# Patient Record
Sex: Female | Born: 1935 | Race: White | Hispanic: No | Smoking: Never smoker
Health system: Southern US, Community
[De-identification: ages and names within clinical notes are randomized; demographics above are authoritative.]

---

## 1998-02-16 ENCOUNTER — Ambulatory Visit (HOSPITAL_COMMUNITY): Admission: RE | Admit: 1998-02-16 | Discharge: 1998-02-16 | Payer: Self-pay

## 1998-02-20 ENCOUNTER — Ambulatory Visit (HOSPITAL_COMMUNITY): Admission: RE | Admit: 1998-02-20 | Discharge: 1998-02-20 | Payer: Self-pay | Admitting: Internal Medicine

## 1998-09-01 ENCOUNTER — Encounter: Payer: Self-pay | Admitting: Internal Medicine

## 1998-09-01 ENCOUNTER — Ambulatory Visit (HOSPITAL_COMMUNITY): Admission: RE | Admit: 1998-09-01 | Discharge: 1998-09-01 | Payer: Self-pay | Admitting: Internal Medicine

## 1999-03-15 ENCOUNTER — Ambulatory Visit (HOSPITAL_COMMUNITY): Admission: RE | Admit: 1999-03-15 | Discharge: 1999-03-15 | Payer: Self-pay | Admitting: Internal Medicine

## 1999-03-15 ENCOUNTER — Encounter: Payer: Self-pay | Admitting: Internal Medicine

## 2000-03-21 ENCOUNTER — Ambulatory Visit (HOSPITAL_COMMUNITY): Admission: RE | Admit: 2000-03-21 | Discharge: 2000-03-21 | Payer: Self-pay | Admitting: Internal Medicine

## 2000-03-21 ENCOUNTER — Encounter: Payer: Self-pay | Admitting: Internal Medicine

## 2001-04-26 ENCOUNTER — Ambulatory Visit (HOSPITAL_COMMUNITY): Admission: RE | Admit: 2001-04-26 | Discharge: 2001-04-26 | Payer: Self-pay | Admitting: Internal Medicine

## 2001-04-26 ENCOUNTER — Encounter: Payer: Self-pay | Admitting: Internal Medicine

## 2002-05-02 ENCOUNTER — Ambulatory Visit (HOSPITAL_COMMUNITY): Admission: RE | Admit: 2002-05-02 | Discharge: 2002-05-02 | Payer: Self-pay | Admitting: Internal Medicine

## 2002-05-02 ENCOUNTER — Encounter: Payer: Self-pay | Admitting: Internal Medicine

## 2003-05-09 ENCOUNTER — Ambulatory Visit (HOSPITAL_COMMUNITY): Admission: RE | Admit: 2003-05-09 | Discharge: 2003-05-09 | Payer: Self-pay | Admitting: Internal Medicine

## 2004-05-13 ENCOUNTER — Ambulatory Visit (HOSPITAL_COMMUNITY): Admission: RE | Admit: 2004-05-13 | Discharge: 2004-05-13 | Payer: Self-pay | Admitting: Internal Medicine

## 2004-12-06 ENCOUNTER — Encounter: Admission: RE | Admit: 2004-12-06 | Discharge: 2004-12-06 | Payer: Self-pay | Admitting: Internal Medicine

## 2005-05-18 ENCOUNTER — Ambulatory Visit (HOSPITAL_COMMUNITY): Admission: RE | Admit: 2005-05-18 | Discharge: 2005-05-18 | Payer: Self-pay | Admitting: Internal Medicine

## 2006-05-19 ENCOUNTER — Ambulatory Visit (HOSPITAL_COMMUNITY): Admission: RE | Admit: 2006-05-19 | Discharge: 2006-05-19 | Payer: Self-pay | Admitting: Internal Medicine

## 2007-05-28 ENCOUNTER — Ambulatory Visit (HOSPITAL_COMMUNITY): Admission: RE | Admit: 2007-05-28 | Discharge: 2007-05-28 | Payer: Self-pay | Admitting: Internal Medicine

## 2008-06-02 ENCOUNTER — Ambulatory Visit (HOSPITAL_COMMUNITY): Admission: RE | Admit: 2008-06-02 | Discharge: 2008-06-02 | Payer: Self-pay | Admitting: Internal Medicine

## 2009-06-03 ENCOUNTER — Ambulatory Visit (HOSPITAL_COMMUNITY): Admission: RE | Admit: 2009-06-03 | Discharge: 2009-06-03 | Payer: Self-pay | Admitting: Internal Medicine

## 2010-05-06 ENCOUNTER — Other Ambulatory Visit (HOSPITAL_COMMUNITY): Payer: Self-pay | Admitting: *Deleted

## 2010-05-06 DIAGNOSIS — Z1231 Encounter for screening mammogram for malignant neoplasm of breast: Secondary | ICD-10-CM

## 2010-06-04 ENCOUNTER — Ambulatory Visit (HOSPITAL_COMMUNITY): Payer: MEDICARE | Attending: *Deleted

## 2010-06-08 ENCOUNTER — Ambulatory Visit (HOSPITAL_BASED_OUTPATIENT_CLINIC_OR_DEPARTMENT_OTHER)
Admission: RE | Admit: 2010-06-08 | Discharge: 2010-06-08 | Disposition: A | Discharge status: Home / Self Care | Payer: MEDICARE | Source: Ambulatory Visit | Attending: Diagnostic Radiology | Admitting: Diagnostic Radiology

## 2010-06-08 ENCOUNTER — Other Ambulatory Visit: Payer: Self-pay

## 2010-06-08 DIAGNOSIS — Z1231 Encounter for screening mammogram for malignant neoplasm of breast: Secondary | ICD-10-CM

## 2011-05-19 ENCOUNTER — Other Ambulatory Visit (HOSPITAL_BASED_OUTPATIENT_CLINIC_OR_DEPARTMENT_OTHER): Payer: Self-pay | Admitting: Internal Medicine

## 2011-05-19 DIAGNOSIS — Z1231 Encounter for screening mammogram for malignant neoplasm of breast: Secondary | ICD-10-CM

## 2011-06-13 ENCOUNTER — Ambulatory Visit (HOSPITAL_BASED_OUTPATIENT_CLINIC_OR_DEPARTMENT_OTHER)
Admission: RE | Admit: 2011-06-13 | Discharge: 2011-06-13 | Disposition: A | Discharge status: Home / Self Care | Payer: Medicare Other | Source: Ambulatory Visit | Attending: Internal Medicine | Admitting: Internal Medicine

## 2011-06-13 DIAGNOSIS — Z1231 Encounter for screening mammogram for malignant neoplasm of breast: Secondary | ICD-10-CM

## 2012-05-23 ENCOUNTER — Other Ambulatory Visit (HOSPITAL_COMMUNITY): Payer: Self-pay | Admitting: Internal Medicine

## 2012-05-23 DIAGNOSIS — M1990 Unspecified osteoarthritis, unspecified site: Secondary | ICD-10-CM

## 2012-05-23 DIAGNOSIS — Z1231 Encounter for screening mammogram for malignant neoplasm of breast: Secondary | ICD-10-CM

## 2012-06-13 ENCOUNTER — Ambulatory Visit (HOSPITAL_COMMUNITY)
Admission: RE | Admit: 2012-06-13 | Discharge: 2012-06-13 | Disposition: A | Discharge status: Home / Self Care | Payer: Medicare Other | Source: Ambulatory Visit | Attending: Internal Medicine | Admitting: Internal Medicine

## 2012-06-13 DIAGNOSIS — M1990 Unspecified osteoarthritis, unspecified site: Secondary | ICD-10-CM

## 2012-06-13 DIAGNOSIS — Z1231 Encounter for screening mammogram for malignant neoplasm of breast: Secondary | ICD-10-CM

## 2012-06-13 DIAGNOSIS — Z13820 Encounter for screening for osteoporosis: Secondary | ICD-10-CM | POA: Insufficient documentation

## 2012-06-13 DIAGNOSIS — Z780 Asymptomatic menopausal state: Secondary | ICD-10-CM | POA: Insufficient documentation

## 2013-06-24 ENCOUNTER — Other Ambulatory Visit (HOSPITAL_BASED_OUTPATIENT_CLINIC_OR_DEPARTMENT_OTHER): Payer: Self-pay | Admitting: Internal Medicine

## 2013-06-24 DIAGNOSIS — Z1231 Encounter for screening mammogram for malignant neoplasm of breast: Secondary | ICD-10-CM

## 2013-07-01 ENCOUNTER — Ambulatory Visit (HOSPITAL_BASED_OUTPATIENT_CLINIC_OR_DEPARTMENT_OTHER)
Admission: RE | Admit: 2013-07-01 | Discharge: 2013-07-01 | Disposition: A | Discharge status: Home / Self Care | Payer: Medicare Other | Source: Ambulatory Visit | Attending: Internal Medicine | Admitting: Internal Medicine

## 2013-07-01 ENCOUNTER — Ambulatory Visit (HOSPITAL_BASED_OUTPATIENT_CLINIC_OR_DEPARTMENT_OTHER): Payer: Medicare Other

## 2013-07-01 DIAGNOSIS — Z1231 Encounter for screening mammogram for malignant neoplasm of breast: Secondary | ICD-10-CM | POA: Insufficient documentation

## 2014-06-23 ENCOUNTER — Other Ambulatory Visit (HOSPITAL_BASED_OUTPATIENT_CLINIC_OR_DEPARTMENT_OTHER): Payer: Self-pay | Admitting: Internal Medicine

## 2014-06-23 DIAGNOSIS — Z1231 Encounter for screening mammogram for malignant neoplasm of breast: Secondary | ICD-10-CM

## 2014-07-08 ENCOUNTER — Ambulatory Visit (HOSPITAL_BASED_OUTPATIENT_CLINIC_OR_DEPARTMENT_OTHER)
Admission: RE | Admit: 2014-07-08 | Discharge: 2014-07-08 | Disposition: A | Discharge status: Home / Self Care | Payer: Medicare Other | Source: Ambulatory Visit | Attending: Internal Medicine | Admitting: Internal Medicine

## 2014-07-08 DIAGNOSIS — Z1231 Encounter for screening mammogram for malignant neoplasm of breast: Secondary | ICD-10-CM | POA: Insufficient documentation

## 2015-09-17 ENCOUNTER — Other Ambulatory Visit (HOSPITAL_BASED_OUTPATIENT_CLINIC_OR_DEPARTMENT_OTHER): Payer: Self-pay | Admitting: Internal Medicine

## 2015-09-17 DIAGNOSIS — Z1231 Encounter for screening mammogram for malignant neoplasm of breast: Secondary | ICD-10-CM

## 2015-09-21 ENCOUNTER — Ambulatory Visit (HOSPITAL_BASED_OUTPATIENT_CLINIC_OR_DEPARTMENT_OTHER)
Admission: RE | Admit: 2015-09-21 | Discharge: 2015-09-21 | Disposition: A | Discharge status: Home / Self Care | Payer: Medicare Other | Source: Ambulatory Visit | Attending: Internal Medicine | Admitting: Internal Medicine

## 2015-09-21 DIAGNOSIS — Z1231 Encounter for screening mammogram for malignant neoplasm of breast: Secondary | ICD-10-CM | POA: Insufficient documentation

## 2016-09-21 ENCOUNTER — Other Ambulatory Visit (HOSPITAL_BASED_OUTPATIENT_CLINIC_OR_DEPARTMENT_OTHER): Payer: Self-pay | Admitting: Internal Medicine

## 2016-09-21 DIAGNOSIS — Z1231 Encounter for screening mammogram for malignant neoplasm of breast: Secondary | ICD-10-CM

## 2016-09-27 ENCOUNTER — Ambulatory Visit (HOSPITAL_BASED_OUTPATIENT_CLINIC_OR_DEPARTMENT_OTHER)
Admission: RE | Admit: 2016-09-27 | Discharge: 2016-09-27 | Disposition: A | Discharge status: Home / Self Care | Payer: Medicare Other | Source: Ambulatory Visit | Attending: Internal Medicine | Admitting: Internal Medicine

## 2016-09-27 DIAGNOSIS — Z1231 Encounter for screening mammogram for malignant neoplasm of breast: Secondary | ICD-10-CM

## 2017-07-26 ENCOUNTER — Ambulatory Visit (INDEPENDENT_AMBULATORY_CARE_PROVIDER_SITE_OTHER)
Admission: RE | Admit: 2017-07-26 | Discharge: 2017-07-26 | Disposition: A | Discharge status: Home / Self Care | Payer: Medicare Other | Source: Ambulatory Visit | Attending: Pulmonary Disease | Admitting: Pulmonary Disease

## 2017-07-26 ENCOUNTER — Encounter: Payer: Self-pay | Admitting: Pulmonary Disease

## 2017-07-26 ENCOUNTER — Ambulatory Visit: Payer: Medicare Other | Admitting: Pulmonary Disease

## 2017-07-26 VITALS — BP 118/80 | HR 60 | Ht 62.0 in | Wt 220.0 lb

## 2017-07-26 DIAGNOSIS — J986 Disorders of diaphragm: Secondary | ICD-10-CM | POA: Diagnosis not present

## 2017-07-26 DIAGNOSIS — R0609 Other forms of dyspnea: Secondary | ICD-10-CM | POA: Insufficient documentation

## 2017-07-26 DIAGNOSIS — G4733 Obstructive sleep apnea (adult) (pediatric): Secondary | ICD-10-CM | POA: Diagnosis not present

## 2017-07-26 DIAGNOSIS — R0602 Shortness of breath: Secondary | ICD-10-CM

## 2017-07-26 NOTE — Assessment & Plan Note (Signed)
We discussed sniff test but she does not want to pursue this especially since there is no therapy

## 2017-07-26 NOTE — Progress Notes (Signed)
Subjective:    Patient ID: Molly BaltimoreShelby Bennett, female    DOB: 1936-02-03, 82 y.o.   MRN: [id number]  HPI  Chief Complaint  Patient presents with  . Pulm/Sleep Consult    Ref[facility] by [facility] Nolen [facility]McKinney [facility] for possible [facility]SA. Patient states that she can't catch her breath during the day.    82 year old never smoker presents for evaluation of dyspnea.  She is actually been evaluated at [facility] and presents for second opinion. She reports excessive sweating on exertion for more than a year.  For the last 6 months she also reports dyspnea on exertion unusual activity such as walking to the mailbox or activities around the house.  There is no associated wheezing, chest pain, orthopnea, paroxysmal nocturnal dyspnea or pedal edema. She was admitted to [facility] for palpitations and underwent cardiac evaluation which was normal including a nuclear stress test and echo in 11/2015.  Review of her imaging studies dating back to 2017 show elevated right hemidiaphragm  She underwent nocturnal oximetry which showed mild desaturations and hence underwent home sleep test which showed an AHI of 9/hour with significant oxygen desaturations and CPAP was recommended.  She felt that CPAP was very expensive and did not refer to her presenting complaint, hence has not started on this yet She was referred to rheumatology for positive ANA but did not seem to have any arthritis symptoms in her hands this was felt to be likely insignificant  She admits to a 10 pound weight gain since retirement from her companion Molly Bennett who accompanies her does report a sedentary lifestyle  She is hypertensive and has been maintained on benazepril for more than 20 years.  She denies cough or wheezing no throat clearing associated with this  Significant tests/ events reviewed  PFTs 03/2017 showed ratio of 98, FEV1 of 57% FVC 58% suggesting of severe restriction.  CT angiogram 10/2015  shows elevated right hemidiaphragm and normal lungs Ambulatory saturation dropped from 98% at baseline to 93% on third lap with heart rate increasing    Past medical history of hypertension. 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    No past medical history on file.. Allergies  Allergen Reactions  . Hydrochlorothiazide W-Triamterene Diarrhea and Rash    diarrhea diarrhea   . Loperamide Rash   No family history on file.   Review of Systems Positive for shortness of breath with activity, irregular heartbeat, acid heartburn, feet swelling  Constitutional: negative for anorexia, fevers and sweats  Eyes: negative for irritation, redness and visual disturbance  Ears, nose, mouth, throat, and face: negative for earaches, epistaxis, nasal congestion and sore throat  Respiratory: negative for cough,sputum and wheezing  Cardiovascular: negative for chest pain,  orthopnea, palpitations and syncope  Gastrointestinal: negative for abdominal pain, constipation, diarrhea, melena, nausea and vomiting  Genitourinary:negative for dysuria, frequency and hematuria  Hematologic/lymphatic: negative for bleeding, easy bruising and lymphadenopathy  Musculoskeletal:negative for arthralgias, muscle weakness and stiff joints  Neurological: negative for coordination problems, gait problems, headaches and weakness  Endocrine: negative for diabetic symptoms including polydipsia, polyuria and weight loss     Objective:   Physical Exam  Gen. Pleasant, obese, in no distress, normal affect ENT - no lesions, no post nasal drip, class 2-3 airway Neck: No JVD, no thyromegaly, no carotid bruits Lungs: no use of accessory muscles, no dullness to percussion, decreased without rales or  rhonchi  Cardiovascular: Rhythm regular, heart sounds  normal, no murmurs or gallops, no peripheral edema Abdomen: soft and  non-tender, no hepatosplenomegaly, BS normal. Musculoskeletal: No deformities, no cyanosis or clubbing Neuro:  alert, non focal, no tremors       Assessment & Plan:

## 2017-07-26 NOTE — Patient Instructions (Signed)
Chest x-ray today.  We discussed test for elevated right diaphragm and decided to hold off  Your sleep apnea is mild Weight loss would help.  Start on an exercise program and try to lose 10-15 pounds

## 2017-07-26 NOTE — Assessment & Plan Note (Signed)
We discussed that she has mild OSA.  Cardiac consequences are negligible for this degree of sleep disordered breathing and okay to hold off on treatment.  Certainly with weight gain she would be at risk for worsening OSA

## 2017-07-26 NOTE — Assessment & Plan Note (Addendum)
Cause is unclear.  PFTs are more clear have not shown any evidence of airway obstruction.  She does not have wheezing to suggest asthma.  Restriction may be related to obesity.  She does have elevated diaphragm dating back at least to 2017 but surprisingly does not have significant orthopnea.  There is no suggestion of  neuromuscular illness to explain restriction.  Lastly this could simply be related to deconditioning and weight gain. I have advised her to undertake an exercise program and target weight loss of 10-15 pounds and then will reassess in 6 months  Mild desaturation is noted today on ambulation.  Will obtain chest x-ray to make sure there is no concern for interstitial lung disease

## 2017-07-28 ENCOUNTER — Telehealth: Payer: Self-pay | Admitting: Pulmonary Disease

## 2017-07-28 NOTE — Telephone Encounter (Signed)
Called and spoke with pt letting her know the results of cxr.  Pt expressed understanding. Nothing further needed at this time. 

## 2017-08-14 ENCOUNTER — Telehealth: Payer: Self-pay | Admitting: Pulmonary Disease

## 2017-08-14 NOTE — Telephone Encounter (Signed)
lmtcb for pt. I do not see where we have contacted this patient recently.

## 2017-08-15 NOTE — Telephone Encounter (Signed)
Spoke with pt, I advised her that there is no documentation of a call. Pt understood and will call her back if needed.

## 2017-09-18 ENCOUNTER — Institutional Professional Consult (permissible substitution): Payer: Medicare Other | Admitting: Pulmonary Disease

## 2017-09-27 ENCOUNTER — Other Ambulatory Visit (HOSPITAL_BASED_OUTPATIENT_CLINIC_OR_DEPARTMENT_OTHER): Payer: Self-pay | Admitting: Internal Medicine

## 2017-09-27 DIAGNOSIS — Z1231 Encounter for screening mammogram for malignant neoplasm of breast: Secondary | ICD-10-CM

## 2017-09-30 ENCOUNTER — Ambulatory Visit (HOSPITAL_BASED_OUTPATIENT_CLINIC_OR_DEPARTMENT_OTHER)
Admission: RE | Admit: 2017-09-30 | Discharge: 2017-09-30 | Disposition: A | Discharge status: Home / Self Care | Payer: Medicare Other | Source: Ambulatory Visit | Attending: Internal Medicine | Admitting: Internal Medicine

## 2017-09-30 DIAGNOSIS — Z1231 Encounter for screening mammogram for malignant neoplasm of breast: Secondary | ICD-10-CM | POA: Insufficient documentation

## 2017-12-19 ENCOUNTER — Other Ambulatory Visit (HOSPITAL_BASED_OUTPATIENT_CLINIC_OR_DEPARTMENT_OTHER): Payer: Self-pay | Admitting: Family Medicine

## 2017-12-19 DIAGNOSIS — Z780 Asymptomatic menopausal state: Secondary | ICD-10-CM

## 2017-12-19 DIAGNOSIS — M8580 Other specified disorders of bone density and structure, unspecified site: Secondary | ICD-10-CM

## 2018-01-26 ENCOUNTER — Encounter: Payer: Self-pay | Admitting: Pulmonary Disease

## 2018-01-26 ENCOUNTER — Ambulatory Visit: Payer: Medicare Other | Admitting: Pulmonary Disease

## 2018-01-26 ENCOUNTER — Ambulatory Visit (INDEPENDENT_AMBULATORY_CARE_PROVIDER_SITE_OTHER)
Admission: RE | Admit: 2018-01-26 | Discharge: 2018-01-26 | Disposition: A | Discharge status: Home / Self Care | Payer: Medicare Other | Source: Ambulatory Visit | Attending: Pulmonary Disease | Admitting: Pulmonary Disease

## 2018-01-26 ENCOUNTER — Other Ambulatory Visit (INDEPENDENT_AMBULATORY_CARE_PROVIDER_SITE_OTHER): Payer: Medicare Other

## 2018-01-26 DIAGNOSIS — G4733 Obstructive sleep apnea (adult) (pediatric): Secondary | ICD-10-CM | POA: Diagnosis not present

## 2018-01-26 DIAGNOSIS — R0609 Other forms of dyspnea: Secondary | ICD-10-CM

## 2018-01-26 LAB — BASIC METABOLIC PANEL
BUN: 29 mg/dL — AB (ref 6–23)
CHLORIDE: 103 meq/L (ref 96–112)
CO2: 31 mEq/L (ref 19–32)
Calcium: 9.6 mg/dL (ref 8.4–10.5)
Creatinine, Ser: 1.27 mg/dL — ABNORMAL HIGH (ref 0.40–1.20)
GFR: 43.01 mL/min — ABNORMAL LOW (ref >60.00)
GLUCOSE: 103 mg/dL — AB (ref 70–99)
POTASSIUM: 4.3 meq/L (ref 3.5–5.1)
Sodium: 140 mEq/L (ref 135–145)

## 2018-01-26 LAB — D-DIMER, QUANTITATIVE: D-Dimer, Quant: 0.91 mcg/mL FEU — ABNORMAL HIGH (ref <0.50)

## 2018-01-26 MED ORDER — IOPAMIDOL (ISOVUE-370) INJECTION 76%
65.0000 mL | Freq: Once | INTRAVENOUS | Status: AC | PRN
  Administered 2018-01-26: 65 mL via INTRAVENOUS

## 2018-01-26 NOTE — Assessment & Plan Note (Signed)
If nothing works then we may take undertake a trial of CPAP therapy-which should help for OSA as well as elevated diaphragm

## 2018-01-26 NOTE — Progress Notes (Signed)
   Subjective:    Patient ID: Molly Bennett, female    DOB: 07/02/1935, 82 y.o.   MRN: [id number]  HPI 82 year old never smoker for follow-up of unexplained dyspnea. Review of imaging studies show elevated diaphragm dating back to 2017 She has mild OSA.  She has been maintained on benazepril for more than 20 years She was admitted to [facility] for palpitations and underwent cardiac evaluation which was normal including a nuclear stress test and echo in 11/2015  Chief Complaint  Patient presents with  . Follow-up    f/u for dyspnea, she reports she is getting worse, SOB exertion mostly, using albuterol as neded,    She feels that dyspnea is worse for the past 6 months.  She reports episodes of sweating and she had an episode of falling down which may have been due to loss of balance and dislocated her finger and a tendon was repaired by surgery. She would like to find out what is causing her dyspnea, there is no associated wheezing or chest pain.  Medication review again shows benazepril, she denies cough or wheezing or throat clearing.  She has occasional pedal edema and is on thiazide  Saturations stayed between 94-95% on walking 3 laps around the office  Significant tests/ events reviewed  HST AHI 9/h  PFTs 03/2017 showed ratio of 98, FEV1 of 57% FVC 58% suggesting of severe restriction.  CT angiogram 10/2015 shows elevated right hemidiaphragm and normal lungs Ambulatory saturation dropped from 98% at baseline to 93% on third lap with heart rate increasing   Review of Systems neg for any significant sore throat, dysphagia, itching, sneezing, nasal congestion or excess/ purulent secretions, fever, chills, sweats, unintended wt loss, pleuritic or exertional cp, hempoptysis, orthopnea pnd or change in chronic leg swelling. Also denies presyncope, palpitations, heartburn, abdominal pain, nausea, vomiting, diarrhea or change in bowel or urinary habits, dysuria,hematuria, rash,  arthralgias, visual complaints, headache, numbness weakness or ataxia.     Objective:   Physical Exam   Gen. Pleasant, obese, in no distress ENT - no lesions, no post nasal drip Neck: No JVD, no thyromegaly, no carotid bruits Lungs: no use of accessory muscles, no dullness to percussion, decreased without rales or rhonchi  Cardiovascular: Rhythm regular, heart sounds  normal, no murmurs or gallops, no peripheral edema Musculoskeletal: No deformities, no cyanosis or clubbing , no tremors        Assessment & Plan:

## 2018-01-26 NOTE — Assessment & Plan Note (Signed)
No clear cause apparent-   Ambulatory satn CT angiogram to r/o PE   If this is negative, then we will consider stopping benazepril and replacing with  losartan and undertake a trial of pulmonary rehab at [facility]

## 2018-01-26 NOTE — Patient Instructions (Signed)
Ambulatory satn CT angiogram to check for blood clots in lungs   If this is negative, then we will consider stopping benazepril and replacing with with another medication in the same family called losartan and undertake a trial of pulmonary rehab at [facility].  If nothing works then we may take undertake a trial of CPAP therapy

## 2018-01-26 NOTE — Addendum Note (Signed)
Addended by: Cydney Ok on: 01/26/2018 11:48 AM   Modules accepted: Orders

## 2018-01-29 ENCOUNTER — Telehealth: Payer: Self-pay | Admitting: Pulmonary Disease

## 2018-01-29 NOTE — Telephone Encounter (Signed)
Notes recorded by Maurene Capes, CMA on 01/26/2018 at 4:53 PM EDT Left message for patient to call back for results. ------  Notes recorded by Oretha Milch, MD on 01/26/2018 at 4:49 PM EDT CT angiogram was negative for pulmonary embolism. No other cause of shortness of breath identified  Called and spoke with pt letting her know the results of the CT scan. Pt expressed understanding. Nothing further needed.

## 2018-03-28 ENCOUNTER — Encounter: Payer: Self-pay | Admitting: Adult Health

## 2018-03-28 ENCOUNTER — Ambulatory Visit: Payer: Medicare Other | Admitting: Adult Health

## 2018-03-28 DIAGNOSIS — G4733 Obstructive sleep apnea (adult) (pediatric): Secondary | ICD-10-CM

## 2018-03-28 DIAGNOSIS — R0609 Other forms of dyspnea: Secondary | ICD-10-CM

## 2018-03-28 DIAGNOSIS — J986 Disorders of diaphragm: Secondary | ICD-10-CM

## 2018-03-28 DIAGNOSIS — I10 Essential (primary) hypertension: Secondary | ICD-10-CM

## 2018-03-28 MED ORDER — VALSARTAN 160 MG PO TABS: 160.0000 mg | ORAL_TABLET | Freq: Every day | ORAL | 2 refills | Status: DC

## 2018-03-28 NOTE — Patient Instructions (Addendum)
Change Benazepril to Diovan 160mg  daily .  Activity as tolerated.  Follow up with Dr. Vassie LollAlva  In 2 months and As needed   Check blood pressure 1-2 times a week and keep log.

## 2018-03-28 NOTE — Assessment & Plan Note (Signed)
Mild sleep apnea with minimal symptoms.  Declined CPAP Weight loss encouraged

## 2018-03-28 NOTE — Assessment & Plan Note (Signed)
Chronically elevated right hemidiaphragm.  Suspect this contributes to some of her shortness of breath.

## 2018-03-28 NOTE — Assessment & Plan Note (Signed)
Dyspnea on exertion x2 years questionable etiology.  Suspect this is multifactorial with underlying obesity deconditioning and elevated right hemidiaphragm.   Unclear if minimum cough is contributed to any of her symptoms.  We will change benazepril to Diovan to see if this makes any difference.  Also could consider changing metoprolol to a more selective beta-blocker such as bisoprolol but for now we will leave this to her primary care physician.  Plan  Patient Instructions  Change Benazepril to Diovan 160mg  daily .  Activity as tolerated.  Follow up with Dr. Vassie LollAlva  In 2 months and As needed   Check blood pressure 1-2 times a week and keep log.

## 2018-03-28 NOTE — Progress Notes (Signed)
@Patient  ID: Molly Bennett, female    DOB: 09-23-1935, 82 y.o.   MRN: [id number]  Chief Complaint  Patient presents with  . Follow-up    Dyspnea     Referring provider: Margaree Mackintosh, MD  HPI: 82 year old female never smoker followed for unexplained dyspnea Medical history significant for elevated diaphragm dating back to 2017 Mild OSA  TEST/EVENTS :  Normal nuclear stress test 2017 Normal Echo 2017 Saturations stayed between 94-95% on walking 3 laps around the office  HST AHI 9/h  PFTs 03/2017 showed ratio of 98, FEV1 of 57% FVC 58% suggesting of severe restriction.  CT angiogram 10/2015 shows elevated right hemidiaphragm and normal lungs Ambulatory saturation dropped from 98% at baseline to 93% on third lap with heart rate increasing   03/28/2018 Follow up: Dyspnea  Patient returns for a 71-month follow-up.  Patient has been undergoing an evaluation for ongoing shortness of breath dating back 2 years.  Patient says is been getting worse.  Is only present when she is doing activities.  She was initially seen at [facility] and underwent a nuclear stress test and echo that was unrevealing.  CT chest in July 2017 showed no pulmonary embolism.  Elevated right hemidiaphragm and normal lungs.  CT chest was repeated January 26, 2018 showed clear lungs and  no PE.  Patient says that she worked up until she was 33 and then retired.  And started noticing that she was getting short of breath with activity.  Says she is not able to be as active due to this.  Walk test in the office last visit showed no desaturations.  Pulmonary function testing showed moderate restriction with no airflow obstruction. A previous sleep study shows some mild sleep apnea.  She is not interested in using a CPAP. She has had labs done her primary care physician.  Patient is on ACE inhibitor has some throat clearing and dry cough.  She is also on metoprolol. No wheezing .  She says that when she tries to  do activities she gets out of breath.  Has to sit down.  Allergies  Allergen Reactions  . Hydrochlorothiazide W-Triamterene Diarrhea and Rash    diarrhea  . Loperamide Rash     There is no immunization history on file for this patient.  History reviewed. No pertinent past medical history.  Tobacco History: Social History   Tobacco Use  Smoking Status Never Smoker  Smokeless Tobacco Never Used   Counseling given: Not Answered   Outpatient Medications Prior to Visit  Medication Sig Dispense Refill  . albuterol (PROVENTIL HFA;VENTOLIN HFA) 108 (90 Base) MCG/ACT inhaler Inhale 2 puffs into the lungs every 4 (four) hours as needed.     Marland Kitchen aspirin EC 81 MG tablet Take 81 mg by mouth daily.     . calcium-vitamin D (OSCAL WITH D) 500-200 MG-UNIT TABS tablet Take 1 tablet by mouth daily.     . DOCOSAHEXAENOIC ACID PO Take 1 g by mouth.    . hydrochlorothiazide (HYDRODIURIL) 25 MG tablet Take 25 mg by mouth daily.   1  . metoprolol tartrate (LOPRESSOR) 50 MG tablet TAKE 1 TABLET(50 MG) BY MOUTH TWICE DAILY    . benazepril (LOTENSIN) 40 MG tablet TAKE 1 TABLET BY MOUTH DAILY     No facility-administered medications prior to visit.      Review of Systems  Constitutional:   No  weight loss, night sweats,  Fevers, chills, fatigue, or  lassitude.  HEENT:  No headaches,  Difficulty swallowing,  Tooth/dental problems, or  Sore throat,                No sneezing, itching, ear ache, nasal congestion, post nasal drip,   CV:  No chest pain,  Orthopnea, PND, swelling in lower extremities, anasarca, dizziness, palpitations, syncope.   GI  No heartburn, indigestion, abdominal pain, nausea, vomiting, diarrhea, change in bowel habits, loss of appetite, bloody stools.   Resp:   No change in color of mucus.  No wheezing.  No chest wall deformity  Skin: no rash or lesions.  GU: no dysuria, change in color of urine, no urgency or frequency.  No flank pain, no hematuria   MS:  No joint pain  or swelling.  No decreased range of motion.  No back pain.    Physical Exam  BP 114/70 (BP Location: Left Arm, Cuff Size: Large)   Pulse 68   Ht 5\' 2"  (1.575 m)   Wt 224 lb (101.6 kg)   SpO2 99%   BMI 40.97 kg/m   GEN: A/Ox3; pleasant , NAD, obese    HEENT:  [place]/AT,  EACs-clear, TMs-wnl, NOSE-clear, THROAT-clear, no lesions, no postnasal drip or exudate noted.   NECK:  Supple w/ fair ROM; no JVD; normal carotid impulses w/o bruits; no thyromegaly or nodules palpated; no lymphadenopathy.    RESP  Clear  P & A; w/o, wheezes/ rales/ or rhonchi. no accessory muscle use, no dullness to percussion  CARD:  RRR, no m/r/g, no peripheral edema, pulses intact, no cyanosis or clubbing.  GI:   Soft & nt; nml bowel sounds; no organomegaly or masses detected.   Musco: Warm bil, no deformities or joint swelling noted.   Neuro: alert, no focal deficits noted.    Skin: Warm, no lesions or rashes    Lab Results:  CBC No results found for: WBC, RBC, HGB, HCT, PLT, MCV, MCH, MCHC, RDW, LYMPHSABS, MONOABS, EOSABS, BASOSABS  BMET    Component Value Date/Time   NA 140 01/26/2018 1201   K 4.3 01/26/2018 1201   CL 103 01/26/2018 1201   CO2 31 01/26/2018 1201   GLUCOSE 103 (H) 01/26/2018 1201   BUN 29 (H) 01/26/2018 1201   CREATININE 1.27 (H) 01/26/2018 1201   CALCIUM 9.6 01/26/2018 1201    BNP No results found for: BNP  ProBNP No results found for: PROBNP  Imaging: No results found.    No flowsheet data found.  No results found for: NITRICOXIDE      Assessment & Plan:   DOE (dyspnea on exertion) Dyspnea on exertion x2 years questionable etiology.  Suspect this is multifactorial with underlying obesity deconditioning and elevated right hemidiaphragm.   Unclear if minimum cough is contributed to any of her symptoms.  We will change benazepril to Diovan to see if this makes any difference.  Also could consider changing metoprolol to a more selective beta-blocker such as  bisoprolol but for now we will leave this to her primary care physician.  Plan  Patient Instructions  Change Benazepril to Diovan 160mg  daily .  Activity as tolerated.  Follow up with Dr. Vassie Loll  In 2 months and As needed   Check blood pressure 1-2 times a week and keep log.      OSA (obstructive sleep apnea) Mild sleep apnea with minimal symptoms.  Declined CPAP Weight loss encouraged  Elevated diaphragm Chronically elevated right hemidiaphragm.  Suspect this contributes to some of her shortness of breath.  HTN (hypertension) We will change ACE inhibitor to ARB to see if this helps with cough.  Advised her to follow with primary care physician for further blood pressure management     Rubye Oaksammy Karys Meckley, NP 03/28/2018

## 2018-03-28 NOTE — Assessment & Plan Note (Signed)
We will change ACE inhibitor to ARB to see if this helps with cough.  Advised her to follow with primary care physician for further blood pressure management

## 2018-05-30 ENCOUNTER — Encounter: Payer: Self-pay | Admitting: Pulmonary Disease

## 2018-05-30 ENCOUNTER — Ambulatory Visit: Payer: Medicare Other | Admitting: Pulmonary Disease

## 2018-05-30 DIAGNOSIS — G4733 Obstructive sleep apnea (adult) (pediatric): Secondary | ICD-10-CM | POA: Diagnosis not present

## 2018-05-30 DIAGNOSIS — R0609 Other forms of dyspnea: Secondary | ICD-10-CM | POA: Diagnosis not present

## 2018-05-30 NOTE — Assessment & Plan Note (Signed)
Again no real cause identified.  Rule out pulmonary embolism in the past, elevated left hemidiaphragm could be a cause.  No evidence of pulmonary hypertension or overt signs of heart failure. Clearly deconditioning is the more likely explanation, weight loss of around 10 pounds advised .  I advised her a formal pulmonary rehab program at the diagnosis of restrictive lung disease but she would like to continue at her existing place for now

## 2018-05-30 NOTE — Progress Notes (Signed)
   Subjective:    Patient ID: Molly Bennett, female    DOB: [birth year], 83 y.o.   MRN: [id number]  HPI  83 year old never smoker for follow-up of unexplained dyspnea. Review of imaging studies show elevated diaphragm dating back to 2017 She has mild OSA.  She has been maintained on benazepril for more than 20 years She was admitted to [facility] for palpitations and underwent cardiac evaluation which was normal including a nuclear stress test and echo in 11/2015  58-month follow-up visit. She has been unable to lose weight.  Shortness of breath persists although she has good and bad days.  These seem to be unrelated to the better.  [facility] it sounds like she is really not exerting much. Oxygen saturation stayed stable on 1 lap but she was dyspneic and had to stop  On her last visit, we had changed from lisinopril to Diovan.  Blood pressure looks good today.  Husband feels that she is doing much better with this change   Significant tests/ events reviewed  HST AHI 9/h  PFTs 03/2017 showed ratio of 98, FEV1 of 57% FVC 58% suggesting of severe restriction.  CT angiogram 10/2015 shows elevated right hemidiaphragm and normal lungs CT angio 01/2018 >>no PE  Ambulatory saturation dropped from 98% at baseline to 93% on third lap with heart rate increasing   Review of Systems Patient denies significant dyspnea,cough, hemoptysis,  chest pain, palpitations, pedal edema, orthopnea, paroxysmal nocturnal dyspnea, lightheadedness, nausea, vomiting, abdominal or  leg pains      Objective:   Physical Exam  Gen. Pleasant, obese, in no distress ENT - no lesions, no post nasal drip Neck: No JVD, no thyromegaly, no carotid bruits Lungs: no use of accessory muscles, no dullness to percussion, decreased without rales or rhonchi  Cardiovascular: Rhythm regular, heart sounds  normal, no murmurs or gallops, no peripheral  edema Musculoskeletal: No deformities, no cyanosis or clubbing , no tremors       Assessment & Plan:

## 2018-05-30 NOTE — Assessment & Plan Note (Signed)
Offered her CPAP therapy for OSA which would also help splint the left hemidiaphragm and see if this would improve her shortness of breath in the daytime. She would like to hold off on this at this time

## 2018-05-30 NOTE — Patient Instructions (Signed)
Ambulatory saturation. Stay on Diovan Weight loss of around 10 pounds recommended. Please call us back when you are ready for referral to [facility] pulmonary rehab program

## 2018-06-22 ENCOUNTER — Other Ambulatory Visit: Payer: Self-pay | Admitting: Adult Health

## 2018-06-22 NOTE — Telephone Encounter (Signed)
Received faxed refill request from [facility] in [place]  Medication name/strength/dose: Valsartan 160mg  Medication last rx'd: 12.11.19 Quantity and number of refills last rx'd: #30 with 2 refills  Patient last seen in the office on 2.12.2020 with Dr Vassie Loll  Does refill need to be authorized by a provider? no Refill authorized (yes or no)?: no - refill deferred to PCP

## 2018-06-29 ENCOUNTER — Telehealth: Payer: Self-pay | Admitting: Pulmonary Disease

## 2018-06-29 MED ORDER — VALSARTAN 160 MG PO TABS: 160.0000 mg | ORAL_TABLET | Freq: Every day | ORAL | 2 refills | Status: DC

## 2018-06-29 NOTE — Telephone Encounter (Signed)
Yes please stay on , can have 2 refills .  Needs follow up with PCP for b/p management  Cont w/ Dr. Vassie Loll  recs from last ov   Please contact office for sooner follow up if symptoms do not improve or worsen or seek emergency care

## 2018-06-29 NOTE — Telephone Encounter (Signed)
Pt is returning call. Cb is [phone].

## 2018-06-29 NOTE — Telephone Encounter (Signed)
Info stated from Assessment & Plan from last OV pt had with TP: We will change benazepril to Diovan to see if this makes any difference.  Also could consider changing metoprolol to a more selective beta-blocker such as bisoprolol but for now we will leave this to her primary care physician.   Called pt but pt was currently not at home. Asked the person who answered the phone if he could have pt return call once she arrived back home.   Will await a return call from pt.

## 2018-06-29 NOTE — Telephone Encounter (Signed)
Called and spoke with pt letting her know that it was okay to refill valsartan but she also needed to contact PCP for BP management. Pt expressed understanding. Verified pt's preferred pharmacy and sent med in for pt. Nothing further needed.

## 2018-06-29 NOTE — Telephone Encounter (Signed)
Spoke with pt, she is requesting a refill on her valsartan but she states that the medication didn't make a difference so she is wondering if she should go back on the benazepril. But wants to know if the valsartan is a newer drug and if it had something in it that wold be more beneficial to her that benazepril. TP please advise. I read her the recommendations from the last visit but still wanted me to give you this message. Thanks

## 2018-09-09 ENCOUNTER — Other Ambulatory Visit: Payer: Self-pay | Admitting: Adult Health

## 2018-09-11 NOTE — Telephone Encounter (Signed)
Per 3.13.2020 phone note, Parrett NP authrorized 2 additional refills with note stating that patient needs to follow up with PCP for b/p management.

## 2018-09-21 ENCOUNTER — Telehealth: Payer: Self-pay | Admitting: Pulmonary Disease

## 2018-09-21 MED ORDER — ALBUTEROL SULFATE HFA 108 (90 BASE) MCG/ACT IN AERS: 2.0000 | INHALATION_SPRAY | RESPIRATORY_TRACT | 1 refills | Status: AC | PRN

## 2018-09-21 NOTE — Telephone Encounter (Signed)
Pt aware Rx sent.  

## 2018-10-08 ENCOUNTER — Other Ambulatory Visit: Payer: Self-pay | Admitting: Adult Health

## 2018-10-08 ENCOUNTER — Telehealth: Payer: Self-pay | Admitting: Pulmonary Disease

## 2018-10-08 MED ORDER — VALSARTAN 160 MG PO TABS: 160.0000 mg | ORAL_TABLET | Freq: Every day | ORAL | 11 refills | Status: DC

## 2018-10-08 NOTE — Telephone Encounter (Signed)
Spoke with the pt  Rx for valsartan was refilled  Nothing further needed

## 2018-10-08 NOTE — Telephone Encounter (Signed)
Left message for patient to call back.   Per 3.13.2020 phone note, Parrett NP authorized 2 additional refills with note stating that patient needs to follow up with PCP for b/p management.

## 2018-10-17 ENCOUNTER — Other Ambulatory Visit (HOSPITAL_BASED_OUTPATIENT_CLINIC_OR_DEPARTMENT_OTHER): Payer: Self-pay | Admitting: Family Medicine

## 2018-10-17 DIAGNOSIS — Z1231 Encounter for screening mammogram for malignant neoplasm of breast: Secondary | ICD-10-CM

## 2018-10-24 ENCOUNTER — Ambulatory Visit (HOSPITAL_BASED_OUTPATIENT_CLINIC_OR_DEPARTMENT_OTHER)
Admission: RE | Admit: 2018-10-24 | Discharge: 2018-10-24 | Disposition: A | Discharge status: Home / Self Care | Payer: Medicare Other | Source: Ambulatory Visit | Attending: Family Medicine | Admitting: Family Medicine

## 2018-10-24 ENCOUNTER — Other Ambulatory Visit: Payer: Self-pay

## 2018-10-24 DIAGNOSIS — Z780 Asymptomatic menopausal state: Secondary | ICD-10-CM

## 2018-10-24 DIAGNOSIS — Z1231 Encounter for screening mammogram for malignant neoplasm of breast: Secondary | ICD-10-CM | POA: Diagnosis present

## 2018-10-24 DIAGNOSIS — M8580 Other specified disorders of bone density and structure, unspecified site: Secondary | ICD-10-CM

## 2018-10-25 ENCOUNTER — Other Ambulatory Visit: Payer: Self-pay | Admitting: Family Medicine

## 2018-10-25 DIAGNOSIS — R928 Other abnormal and inconclusive findings on diagnostic imaging of breast: Secondary | ICD-10-CM

## 2018-10-29 ENCOUNTER — Other Ambulatory Visit: Payer: Self-pay | Admitting: Internal Medicine

## 2018-10-29 DIAGNOSIS — R928 Other abnormal and inconclusive findings on diagnostic imaging of breast: Secondary | ICD-10-CM

## 2018-10-30 ENCOUNTER — Ambulatory Visit
Admission: RE | Admit: 2018-10-30 | Discharge: 2018-10-30 | Disposition: A | Discharge status: Home / Self Care | Payer: Medicare Other | Source: Ambulatory Visit | Attending: Family Medicine | Admitting: Family Medicine

## 2018-10-30 DIAGNOSIS — R928 Other abnormal and inconclusive findings on diagnostic imaging of breast: Secondary | ICD-10-CM

## 2019-05-02 IMAGING — DX DG CHEST 2V
2 series · 2 of 2 positions shown · non-contrast
Comparison: Chest CT 10/27/2015

CLINICAL DATA: Dyspnea on exertion, x 6 mos, HTN,

EXAM:
CHEST - 2 VIEW

[chest pa]
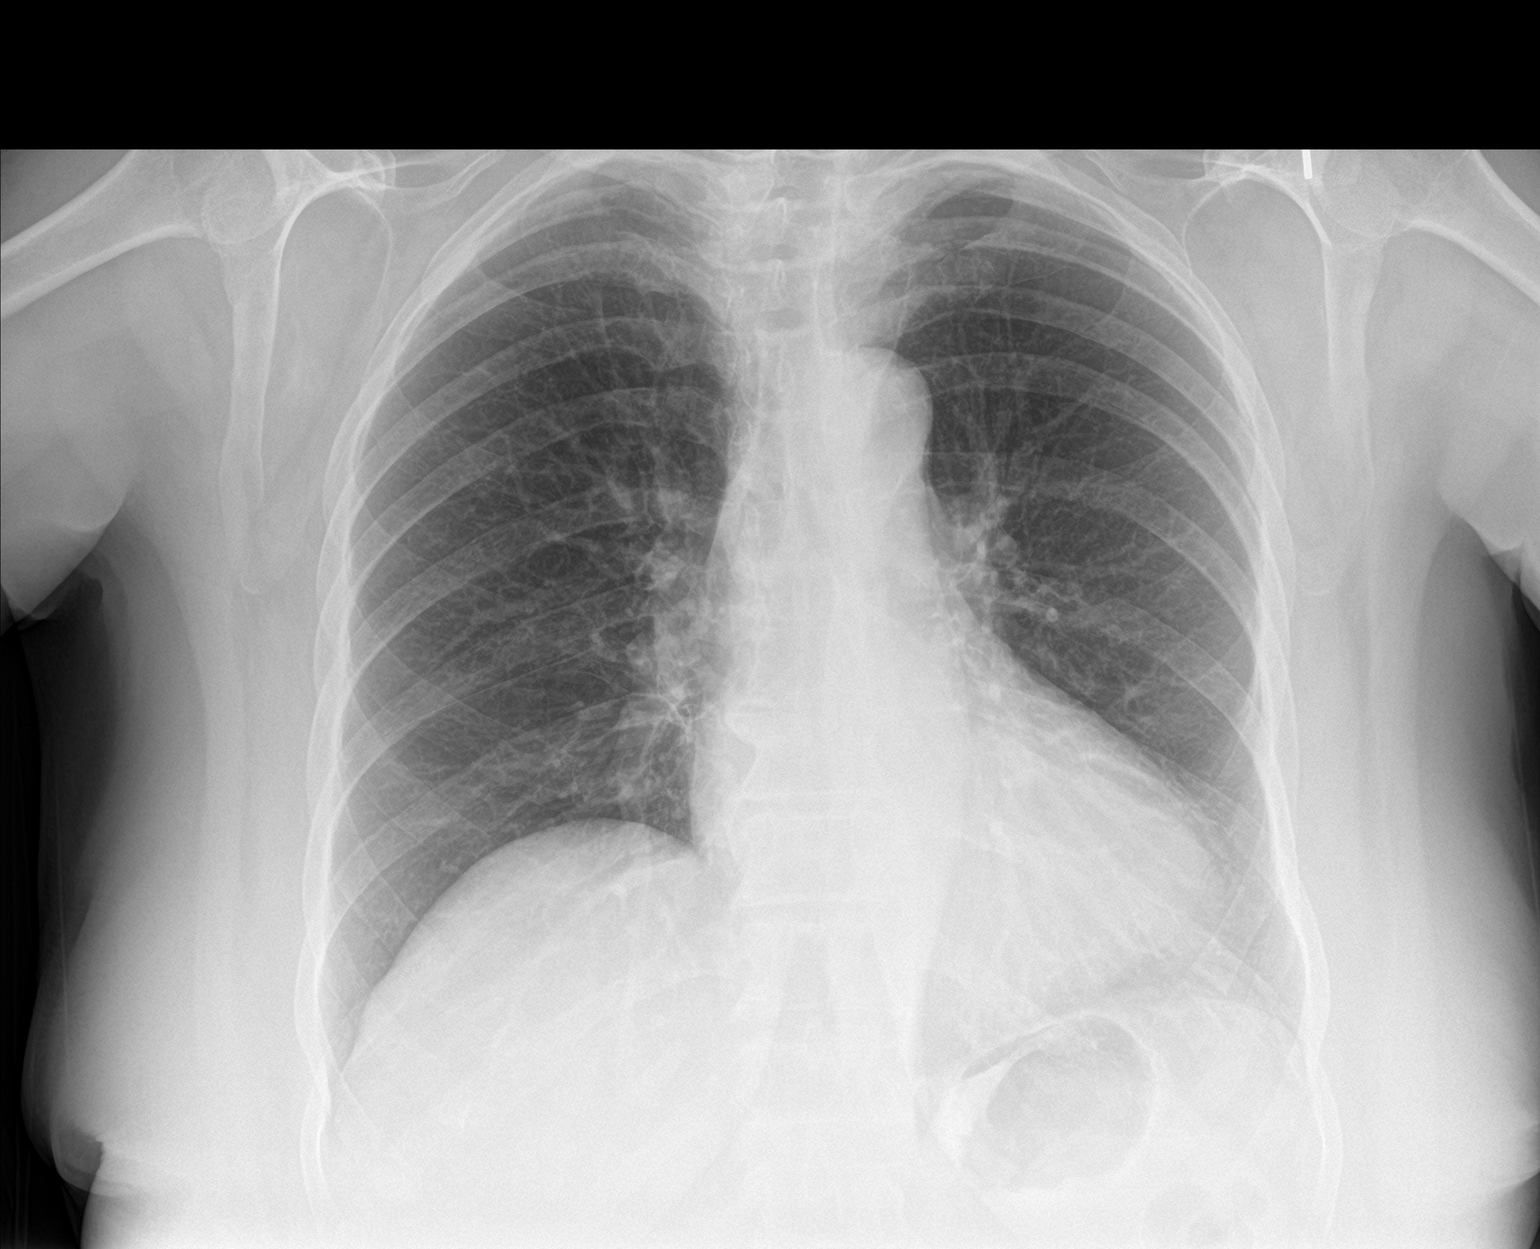

[chest lat]
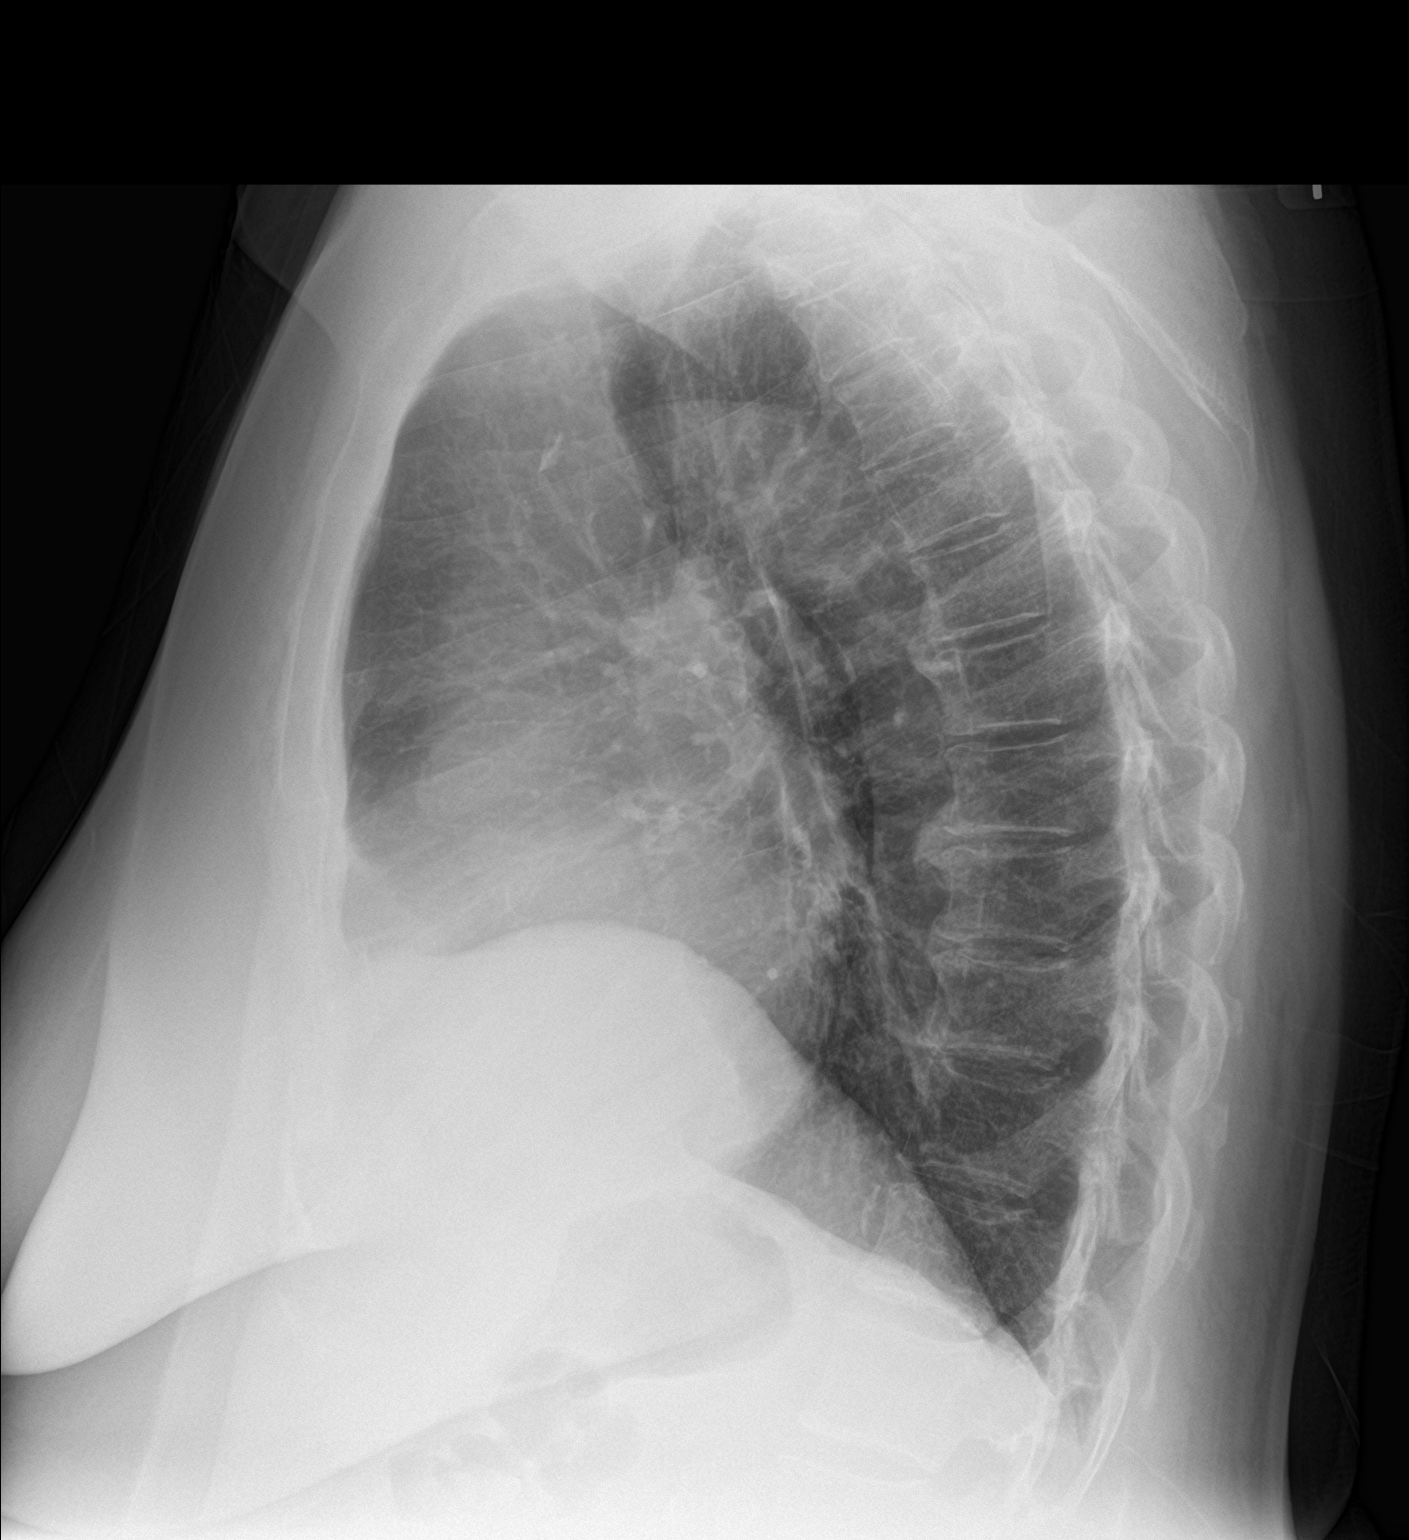

[2 of 2 positions shown; findings below may reference images not displayed]

FINDINGS: Normal mediastinum and cardiac silhouette. Normal pulmonary
vasculature. No evidence of effusion, infiltrate, or pneumothorax.
No acute bony abnormality.
IMPRESSION: No acute cardiopulmonary process.

## 2019-09-06 ENCOUNTER — Other Ambulatory Visit: Payer: Self-pay | Admitting: Pulmonary Disease

## 2019-09-25 ENCOUNTER — Other Ambulatory Visit: Payer: Self-pay | Admitting: Internal Medicine

## 2019-09-25 DIAGNOSIS — Z1231 Encounter for screening mammogram for malignant neoplasm of breast: Secondary | ICD-10-CM

## 2019-10-06 ENCOUNTER — Other Ambulatory Visit: Payer: Self-pay | Admitting: Pulmonary Disease

## 2019-10-07 NOTE — Telephone Encounter (Signed)
Dr. Alva, please advise if you are okay with us refilling med or if it needs to be deferred to PCP. 

## 2019-10-08 NOTE — Telephone Encounter (Signed)
Defer to PCP

## 2019-10-30 ENCOUNTER — Ambulatory Visit
Admission: RE | Admit: 2019-10-30 | Discharge: 2019-10-30 | Disposition: A | Discharge status: Home / Self Care | Payer: Medicare Other | Source: Ambulatory Visit | Attending: Internal Medicine | Admitting: Internal Medicine

## 2019-10-30 DIAGNOSIS — Z1231 Encounter for screening mammogram for malignant neoplasm of breast: Secondary | ICD-10-CM

## 2019-11-21 ENCOUNTER — Other Ambulatory Visit: Payer: Self-pay | Admitting: Pulmonary Disease

## 2020-01-15 ENCOUNTER — Other Ambulatory Visit: Payer: Self-pay | Admitting: Pulmonary Disease

## 2020-02-19 ENCOUNTER — Other Ambulatory Visit: Payer: Self-pay | Admitting: Pulmonary Disease

## 2020-08-05 IMAGING — US ULTRASOUND RIGHT BREAST LIMITED
1 series · 13 of 14 positions shown · non-contrast
Comparison: Previous exam(s).

CLINICAL DATA: 82-year-old female presenting for recall for a
possible right breast asymmetry.

EXAM:
DIGITAL DIAGNOSTIC UNILATERAL RIGHT MAMMOGRAM WITH CAD AND TOMO
RIGHT BREAST ULTRASOUND

[Series 1: ultrasound right breast limited · 0.06mm/px · 13 of 14 slices shown]
[im 1/14]
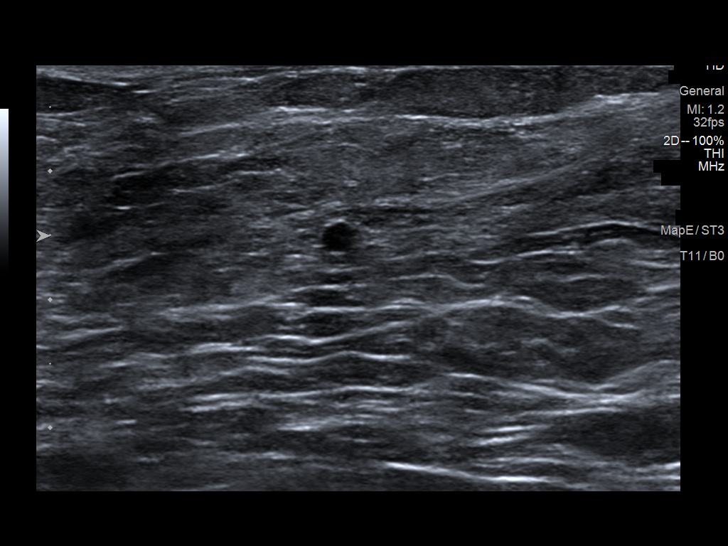
[im 2/14]
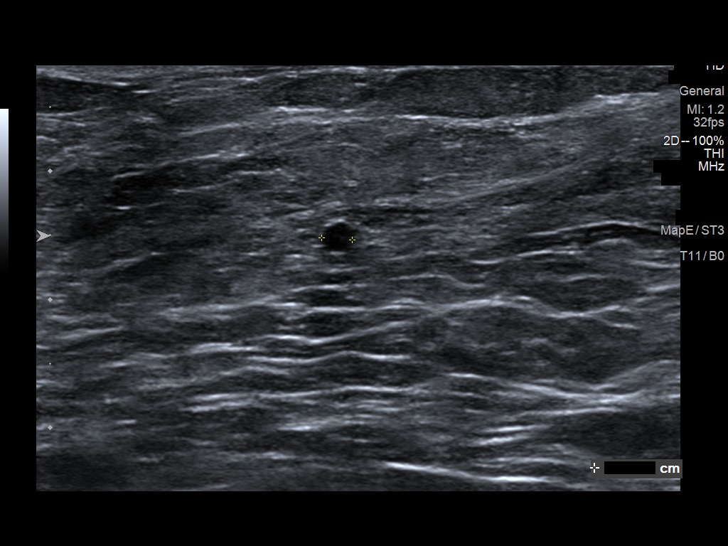
[im 3/14]
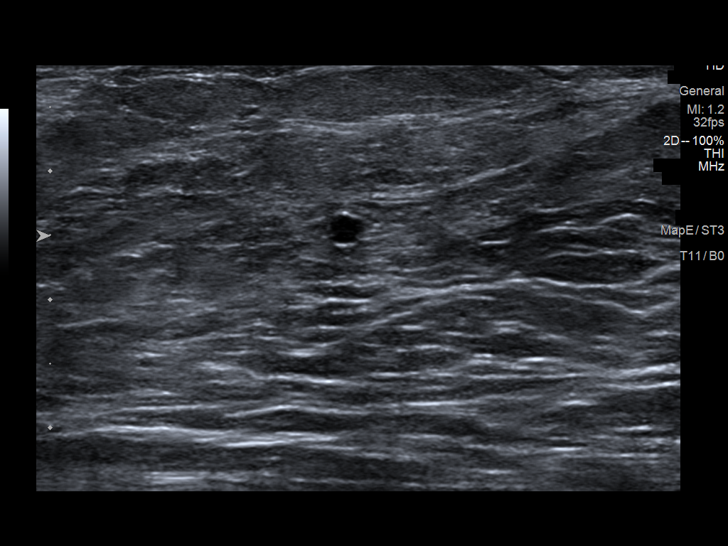
[im 4/14]
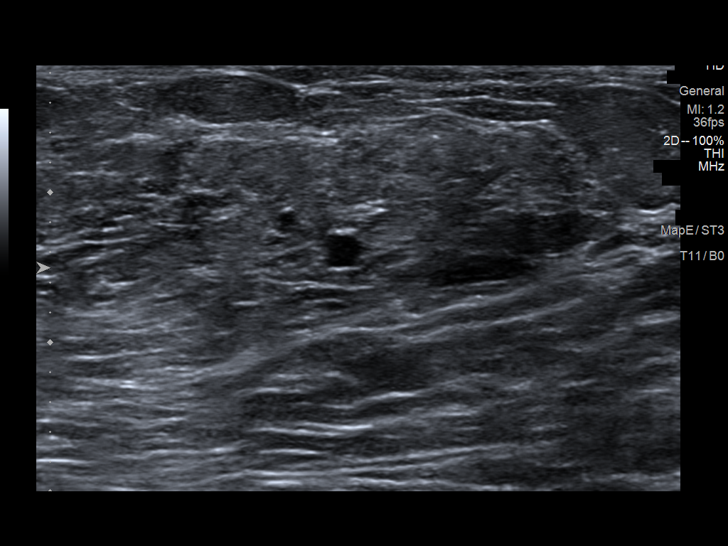
[im 5/14]
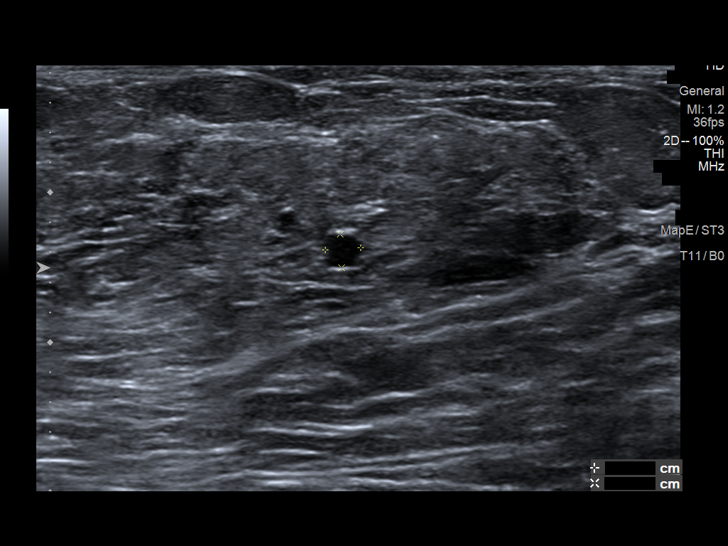
[im 6/14]
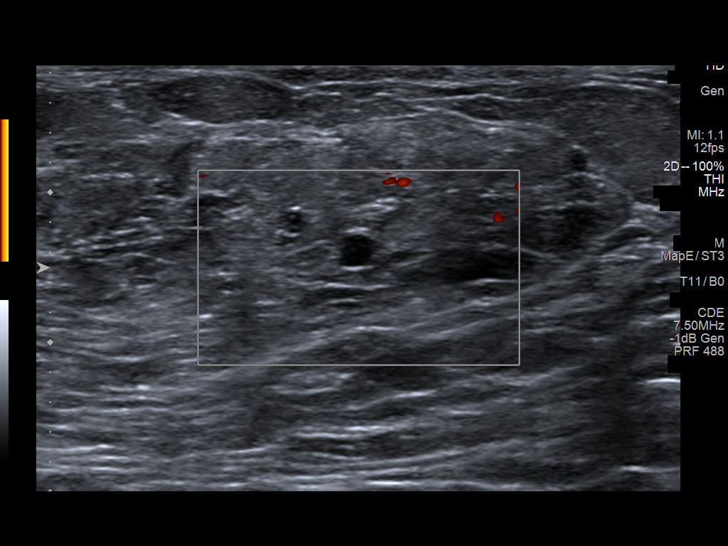
[im 8/14]
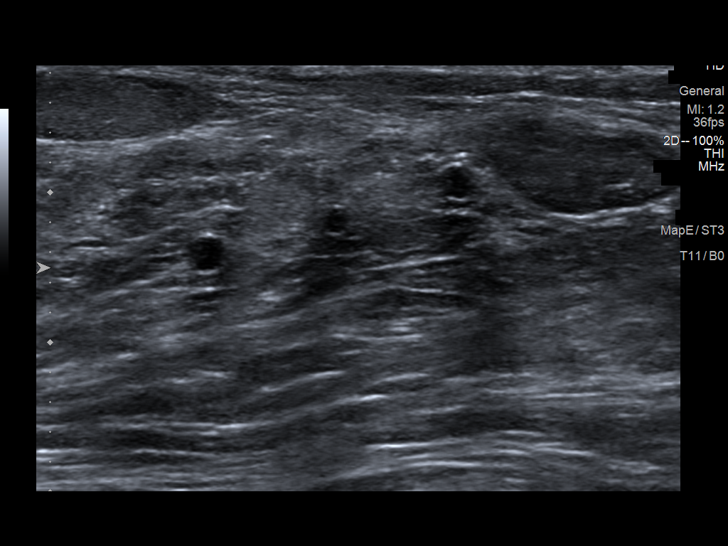
[im 9/14]
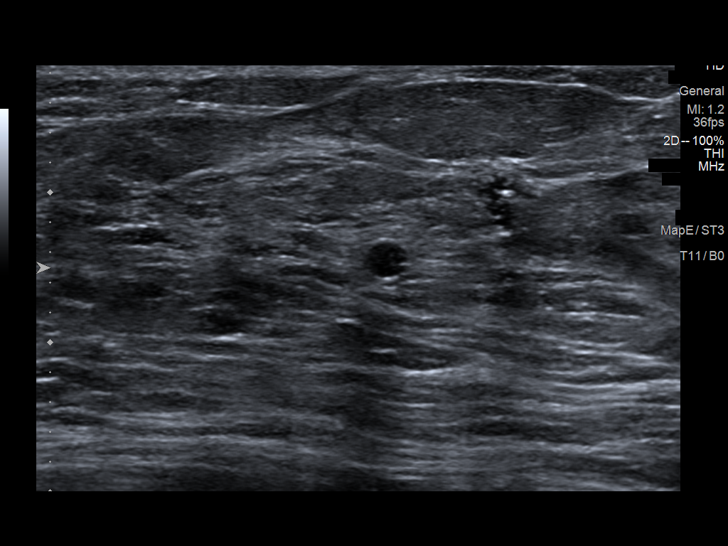
[im 10/14]
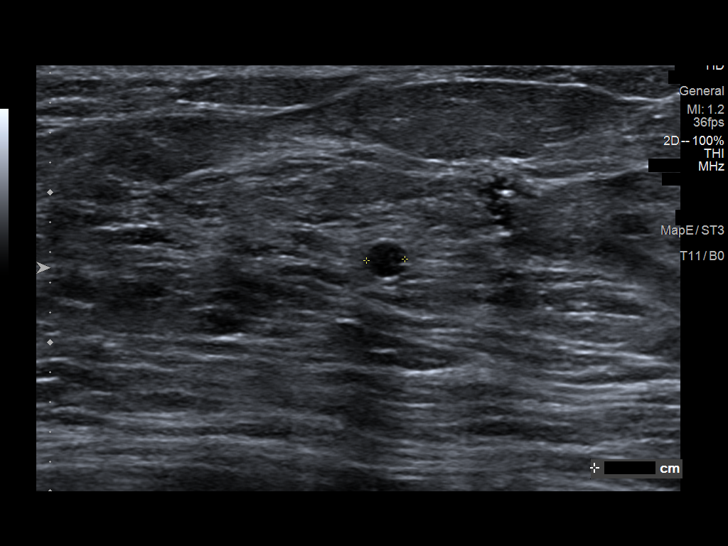
[im 11/14]
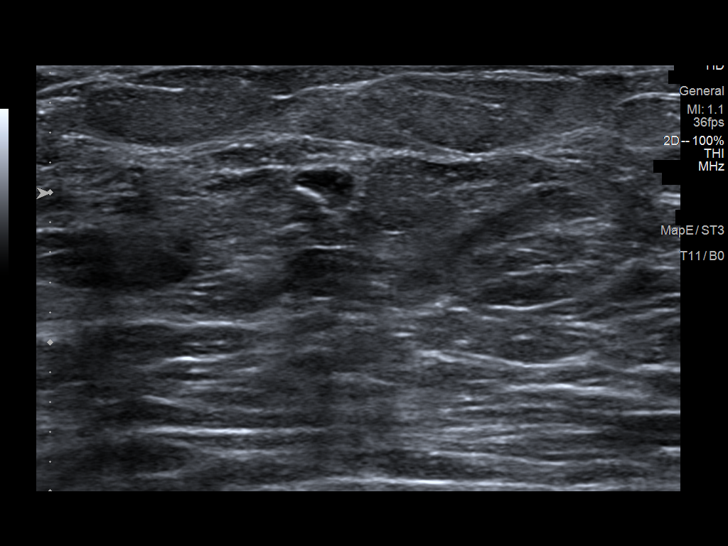
[im 12/14]
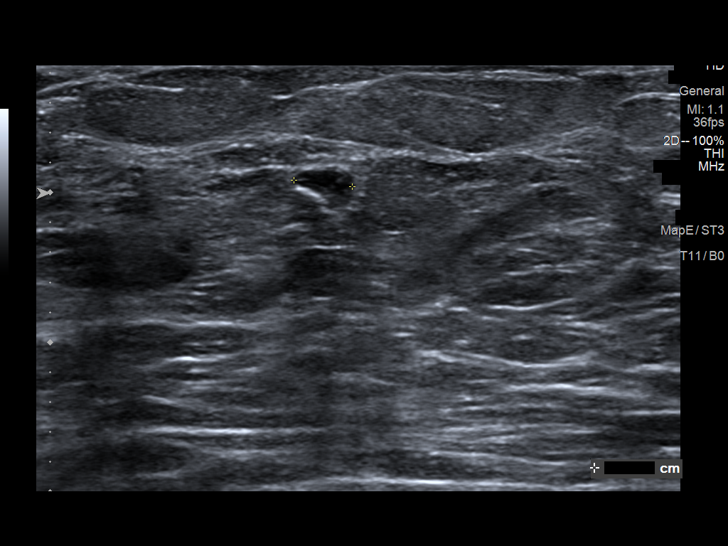
[im 13/14]
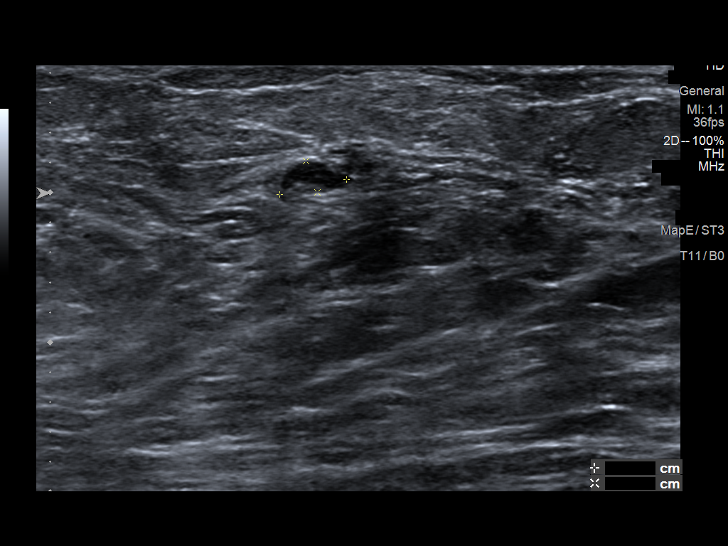
[im 14/14]
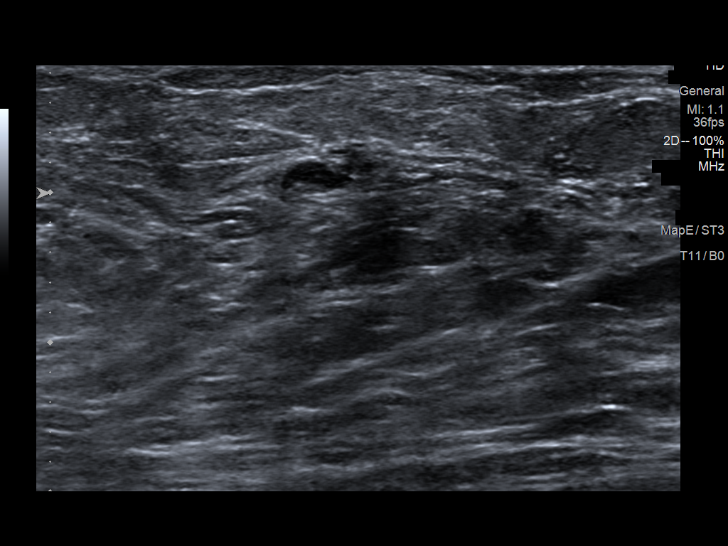

[13 of 14 positions shown; findings below may reference images not displayed]

ACR Breast Density Category b: There are scattered areas of
fibroglandular density.
FINDINGS: In the lateral aspect of the right breast, middle depth there is a
circumscribed small lobulated mass measuring approximately 7 mm.

Mammographic images were processed with CAD.

Ultrasound of the lateral and lower outer right breast demonstrates
multiple tiny anechoic circumscribed masses compatible with cysts.
The largest of these is at 8 o'clock, 2 cm from the nipple and
measures up to 5 mm. This cyst is slightly elongated, and likely
corresponds with the mass identified on the screening mammogram.
IMPRESSION: The mass in the lateral right breast corresponds with benign cysts.

RECOMMENDATION:
Screening mammogram in one year.(Code:5F-[id number]-UEY)

I have discussed the findings and recommendations with the patient.
Results were also provided in writing at the conclusion of the
visit. If applicable, a reminder letter will be sent to the patient
regarding the next appointment.

BI-RADS CATEGORY  2: Benign.

## 2020-09-22 ENCOUNTER — Other Ambulatory Visit: Payer: Self-pay | Admitting: Internal Medicine

## 2020-09-22 DIAGNOSIS — Z1231 Encounter for screening mammogram for malignant neoplasm of breast: Secondary | ICD-10-CM

## 2020-11-07 ENCOUNTER — Ambulatory Visit
Admission: RE | Admit: 2020-11-07 | Discharge: 2020-11-07 | Disposition: A | Discharge status: Home / Self Care | Payer: Medicare Other | Source: Ambulatory Visit | Attending: Internal Medicine | Admitting: Internal Medicine

## 2020-11-07 ENCOUNTER — Ambulatory Visit: Payer: Medicare Other

## 2020-11-07 ENCOUNTER — Other Ambulatory Visit: Payer: Self-pay

## 2020-11-07 DIAGNOSIS — Z1231 Encounter for screening mammogram for malignant neoplasm of breast: Secondary | ICD-10-CM

## 2020-11-16 ENCOUNTER — Ambulatory Visit: Payer: Medicare Other

## 2020-12-17 ENCOUNTER — Ambulatory Visit: Payer: Medicare Other

## 2021-10-15 ENCOUNTER — Other Ambulatory Visit: Payer: Self-pay | Admitting: Internal Medicine

## 2021-10-15 ENCOUNTER — Other Ambulatory Visit: Payer: Self-pay | Admitting: Family Medicine

## 2021-10-15 DIAGNOSIS — Z1231 Encounter for screening mammogram for malignant neoplasm of breast: Secondary | ICD-10-CM

## 2021-11-09 ENCOUNTER — Ambulatory Visit
Admission: RE | Admit: 2021-11-09 | Discharge: 2021-11-09 | Disposition: A | Discharge status: Home / Self Care | Payer: Medicare Other | Source: Ambulatory Visit | Attending: Internal Medicine | Admitting: Internal Medicine

## 2021-11-09 DIAGNOSIS — Z1231 Encounter for screening mammogram for malignant neoplasm of breast: Secondary | ICD-10-CM

## 2022-01-12 ENCOUNTER — Other Ambulatory Visit: Payer: Self-pay | Admitting: Internal Medicine

## 2022-01-12 DIAGNOSIS — N6320 Unspecified lump in the left breast, unspecified quadrant: Secondary | ICD-10-CM

## 2022-01-20 ENCOUNTER — Ambulatory Visit
Admission: RE | Admit: 2022-01-20 | Discharge: 2022-01-20 | Disposition: A | Discharge status: Home / Self Care | Payer: Medicare Other | Source: Ambulatory Visit | Attending: Internal Medicine | Admitting: Internal Medicine

## 2022-01-20 DIAGNOSIS — N6320 Unspecified lump in the left breast, unspecified quadrant: Secondary | ICD-10-CM

## 2022-12-15 ENCOUNTER — Other Ambulatory Visit: Payer: Self-pay | Admitting: Family Medicine

## 2022-12-15 DIAGNOSIS — Z1231 Encounter for screening mammogram for malignant neoplasm of breast: Secondary | ICD-10-CM
